# Patient Record
Sex: Female | Born: 1935
Health system: Southern US, Community
[De-identification: ages and names within clinical notes are randomized; demographics above are authoritative.]

## PROBLEM LIST (undated history)

## (undated) DIAGNOSIS — E119 Type 2 diabetes mellitus without complications: Secondary | ICD-10-CM

## (undated) HISTORY — PX: ANKLE SURGERY: SHX546

---

## 2001-10-25 ENCOUNTER — Encounter: Payer: Self-pay | Admitting: Family Medicine

## 2001-10-25 ENCOUNTER — Ambulatory Visit (HOSPITAL_COMMUNITY): Admission: RE | Admit: 2001-10-25 | Discharge: 2001-10-25 | Payer: Self-pay | Admitting: Family Medicine

## 2002-07-31 ENCOUNTER — Ambulatory Visit (HOSPITAL_COMMUNITY): Admission: RE | Admit: 2002-07-31 | Discharge: 2002-07-31 | Payer: Self-pay | Admitting: Family Medicine

## 2002-07-31 ENCOUNTER — Encounter: Payer: Self-pay | Admitting: Family Medicine

## 2002-11-22 ENCOUNTER — Encounter: Payer: Self-pay | Admitting: Family Medicine

## 2002-11-22 ENCOUNTER — Ambulatory Visit (HOSPITAL_COMMUNITY): Admission: RE | Admit: 2002-11-22 | Discharge: 2002-11-22 | Payer: Self-pay | Admitting: Family Medicine

## 2003-02-20 ENCOUNTER — Ambulatory Visit (HOSPITAL_COMMUNITY): Admission: RE | Admit: 2003-02-20 | Discharge: 2003-02-20 | Payer: Self-pay | Admitting: Family Medicine

## 2003-02-20 ENCOUNTER — Encounter: Payer: Self-pay | Admitting: Family Medicine

## 2005-05-17 ENCOUNTER — Ambulatory Visit (HOSPITAL_COMMUNITY): Admission: RE | Admit: 2005-05-17 | Discharge: 2005-05-17 | Payer: Self-pay | Admitting: Family Medicine

## 2005-09-06 ENCOUNTER — Ambulatory Visit (HOSPITAL_COMMUNITY): Admission: RE | Admit: 2005-09-06 | Discharge: 2005-09-06 | Payer: Self-pay | Admitting: Family Medicine

## 2005-11-21 ENCOUNTER — Encounter (HOSPITAL_BASED_OUTPATIENT_CLINIC_OR_DEPARTMENT_OTHER): Admission: RE | Admit: 2005-11-21 | Discharge: 2006-01-02 | Payer: Self-pay | Admitting: Surgery

## 2006-01-03 ENCOUNTER — Ambulatory Visit (HOSPITAL_COMMUNITY): Admission: RE | Admit: 2006-01-03 | Discharge: 2006-01-03 | Payer: Self-pay | Admitting: Family Medicine

## 2007-05-07 ENCOUNTER — Encounter (HOSPITAL_BASED_OUTPATIENT_CLINIC_OR_DEPARTMENT_OTHER): Admission: RE | Admit: 2007-05-07 | Discharge: 2007-06-19 | Payer: Self-pay | Admitting: Surgery

## 2007-06-18 ENCOUNTER — Encounter (HOSPITAL_BASED_OUTPATIENT_CLINIC_OR_DEPARTMENT_OTHER): Admission: RE | Admit: 2007-06-18 | Discharge: 2007-09-16 | Payer: Self-pay | Admitting: Surgery

## 2010-04-05 ENCOUNTER — Ambulatory Visit (HOSPITAL_COMMUNITY): Admission: RE | Admit: 2010-04-05 | Discharge: 2010-04-05 | Payer: Self-pay | Admitting: Family Medicine

## 2010-06-08 ENCOUNTER — Ambulatory Visit (HOSPITAL_COMMUNITY): Admission: RE | Admit: 2010-06-08 | Discharge: 2010-06-08 | Payer: Self-pay | Admitting: Family Medicine

## 2010-06-18 ENCOUNTER — Ambulatory Visit (HOSPITAL_COMMUNITY): Admission: RE | Admit: 2010-06-18 | Discharge: 2010-06-18 | Payer: Self-pay | Admitting: Orthopedic Surgery

## 2010-12-02 LAB — CBC
Hemoglobin: 13.2 g/dL (ref 12.0–15.0)
MCH: 29.1 pg (ref 26.0–34.0)
MCHC: 33.2 g/dL (ref 30.0–36.0)
RBC: 4.54 MIL/uL (ref 3.87–5.11)
WBC: 6 10*3/uL (ref 4.0–10.5)

## 2010-12-02 LAB — ANAEROBIC CULTURE

## 2010-12-02 LAB — BASIC METABOLIC PANEL
BUN: 10 mg/dL (ref 6–23)
CO2: 27 mEq/L (ref 19–32)
Calcium: 9.2 mg/dL (ref 8.4–10.5)
Chloride: 105 mEq/L (ref 96–112)
GFR calc non Af Amer: >60 mL/min (ref >60)
Sodium: 140 mEq/L (ref 135–145)

## 2010-12-02 LAB — AFB CULTURE WITH SMEAR (NOT AT ARMC)

## 2010-12-02 LAB — TISSUE CULTURE: Culture: NO GROWTH

## 2011-02-01 NOTE — Assessment & Plan Note (Signed)
[facility]   NAMEWANITA, Tamara Contreras                ACCOUNT NO.:  [id number]   MEDICAL RECORD NO.:  [id number]      DATE OF BIRTH:  1929-08-29   PHYSICIAN:  Theresia Majors. Tanda Rockers, M.D. VISIT DATE:  06/21/2007                                   OFFICE VISIT   SUBJECTIVE:  Tamara Contreras is a 77 year old lady who we have followed for  stasis ulcer involving her right lower extremity.  In the interim we  have treated her with a Unna wrap.  She returns for follow-up.  There  has been no interim excessive drainage, malodor, pain or fever.  She has  complained of some tightness of the wrap.   OBJECTIVE:  Blood pressure is 181/66, respirations 18, pulse rate 71.  She is accompanied by her daughter.  Inspection of the right lower  extremity shows that the edema has been well controlled with the Unna  wrap.  There is no evidence of wrap injury. The wound is 99% re-  epithelialization. Prominent chronic venous stasis changes are  persistent. The dorsalis pedis pulses 3+.   ASSESSMENT:  Clinical improvement of the stasis ulcer.   PLAN:  We have given the patient prescription for 30-40 mm below-the-  knee compression hose.  We have encouraged her to procure this garment  and to bring it with her during her next visit we anticipate that the  wound should be completely resolved and we will make a transition to the  support hose.  We have given the daughter and the patient opportunity to  ask questions.  They seem to understand and indicate that they will be  compliant.      Harold A. Tanda Rockers, M.D.  Electronically Signed     HAN/MEDQ  D:  06/21/2007  T:  06/21/2007  Job:  [id number]

## 2011-02-01 NOTE — Consult Note (Signed)
Tamara Contreras, Tamara Contreras                ACCOUNT NO.:  [id number]   MEDICAL RECORD NO.:  [id number]          PATIENT TYPE:  REC   LOCATION:  FOOT                         FACILITY:  [facility]   PHYSICIAN:  Jonelle Sports. Sevier, M.D. DATE OF BIRTH:  Oct 08, 1928   DATE OF CONSULTATION:  06/06/2007  DATE OF DISCHARGE:                                 CONSULTATION   HISTORY:  This 77 year old white female is followed for a recurrent and  somewhat refractory venous stasis ulceration on the medial aspect of the  right lower extremity.   She has been under treatment here again since August 2008, and has shown  some gradual improvement but is not yet healed.  She has been having  Unna wraps with the weekly changes.   She reports no problems since her previous visit except that she feels  the wrap is a little tight at its proximal margin.  The skin does show  some  imprintation, but there is no evidence of any injury from the wrap  itself.   She has noted no increase in pain.  No odor, no fever or systemic  symptoms.   PHYSICAL EXAMINATION:  VITAL SIGNS:  Blood pressure 159/84, pulse 85,  respirations 16, temperature 98.5.  The right lower extremity is free of  edema from the knee down where it has been involved in the wrap.  There  is evidence of chronic venous insufficiency and some stasis skin  changes.  The ulcer itself now measures 0.9 x 0.9 x 0.05 cm, has a clean  base, some loose epidermis around the margins.  There is no odor and no  significant drainage.   IMPRESSION:  Satisfactory progress chronic venous ulcer.   DISPOSITION:  1. The loose skin at the wound margins is selectively debrided.  The      wound is then treated with an application of Promogran, and that      extremity placed again in an Unna wrap.  2. Follow-up visit will be here in one week.           ______________________________  Jonelle Sports. Cheryll Cockayne, M.D.     RES/MEDQ  D:  06/06/2007  T:  06/06/2007  Job:  [phone]

## 2011-02-01 NOTE — Assessment & Plan Note (Signed)
[facility]   NAMEBLAKLEE, SHORES                ACCOUNT NO.:  [id number]   MEDICAL RECORD NO.:  [id number]      DATE OF BIRTH:  June 24, 1929   PHYSICIAN:  Theresia Majors. Tanda Rockers, M.D. VISIT DATE:  05/24/2007                                   OFFICE VISIT   SUBJECTIVE:  Ms. Achenbach is a 77 year old lady who we are following for a  stasis ulcer involving the medial right lower extremity.  In the  interim, we have treated her with Promogran and an Unna wrap.  She  returns for followup.  There has been mild drainage, but no malodor, no  pain and no fever.  She is accompanied by her daughter.   OBJECTIVE:  Blood pressure is 169/81, respirations 16, pulse rate 85,  temperature 98.1.  Inspection of the wound shows that there is 100%  granulating base.  The wound is rather deep.  No debridement is needed.  The wound was measured, photographed and catalogued.  Please refer to  the data entries.  The pedal pulse is 3+ and bounding.   ASSESSMENT:  Clinical response to compression.   PLAN:  Continue the Unna wrap q.week per the wound nurse visits only,  with the M.D. to follow up on a monthly basis.  The patient will also  proceed with fitting for below-the-knee 30-40-mm compression hose.      Harold A. Tanda Rockers, M.D.  Electronically Signed     HAN/MEDQ  D:  05/24/2007  T:  05/24/2007  Job:  [id number]

## 2011-02-01 NOTE — Assessment & Plan Note (Signed)
[facility]   NAMECAPRISHA, Tamara                ACCOUNT NO.:  [id number]   MEDICAL RECORD NO.:  [id number]      DATE OF BIRTH:  09/06/1929   PHYSICIAN:  Theresia Majors. Tanda Rockers, M.D. VISIT DATE:  05/09/2007                                   OFFICE VISIT   SUBJECTIVE:  Tamara Contreras is a 77 year old lady who was last seen in the  wound center in March 2007, at time she was successfully treated for a  stasis ulcer involving the left lower extremity.  She was discharged  with compression hose and has done well until recently.  She developed a  blister approximately 4 weeks ago, which ruptured.  The patient treated  herself with topical neosporin, but in spite of this, the wound has  become larger.  She was seen by her primary care physician, Dr. Butch Penny and was referred to the wound center.  She has had no major  changes in her medication list.  In the interim she has been on  Levaquin.   OBJECTIVE:  Blood pressure is 170/86, respirations 18, pulse rate 95,  temperature 97.1.  She is accompanied by her daughter.  Inspection of  the lower extremity shows that there is bilateral 2+ edema with chronic  changes of stasis including several prominent varicosities.  On the left  medial ankle there is desquamation with extremely thin skin and  impending ulceration.  There is 3+ dorsalis pedis pulse on the right  lower extremity.  There is frank punched-out ulceration over the  superior to the medial malleolus with discoloration.  There is no  evidence of abscess formation, lymphangitis, or ascending cellulitis.   ASSESSMENT:  Bilateral stasis with ulcerations.   PLAN:  We have placed the patient in bilateral compression wraps with  triamcinolone ointment and  Silverlon swathes.  We will reevaluate her  in one week in the nurses only clinic.  She will be evaluated by a  physician in one month p.r.n.      Harold A. Tanda Rockers, M.D.  Electronically Signed     HAN/MEDQ   D:  05/09/2007  T:  05/10/2007  Job:  [id number]

## 2011-02-01 NOTE — Assessment & Plan Note (Signed)
[facility]   Tamara Contreras, Tamara Contreras                ACCOUNT NO.:  [id number]   MEDICAL RECORD NO.:  [id number]      DATE OF BIRTH:  [birth year]   PHYSICIAN:  Theresia Majors. Tanda Rockers, M.D. VISIT DATE:  05/17/2007                                   OFFICE VISIT   SUBJECTIVE:  Tamara Contreras returns for followup of bilateral stasis with  ulceration involving the medial aspect of the right lower extremity.  In  the interim, she denies excessive drainage, malodor, pain, or fever.  She is accompanied by her daughter.   OBJECTIVE:  VITAL SIGNS:  Blood pressure is 172/79, respirations 16,  pulse rate 88, temperature 97.1.  EXTREMITIES:  Inspection of the right medial leg shows that the stasis  ulcer has contracted slightly.  There is undermining with shaggy  necrotic nonviable tissue at the circumference.  An EMLA cream was  applied topically.  Thereafter, an excisional debridement was performed  without difficulty.  The skin, subcutaneous tissue, and chronic  inflammatory reactive tissue was removed.  Hemorrhage was controlled  with direct pressure.  The pedal pulses remained +3 bilaterally.  There  is associated bilateral 2+ edema with chronic changes of stasis.  There  is no evidence of ascending infection or abscess.   ASSESSMENT:  Improved stasis, left lower extremity; adequately debrided  stasis ulcer on the right lower extremity.   PLAN:  We have placed the patient in a matrix dressing with an Unna  wrap.  We will reevaluate her in 1 week.  We have also given her a  prescription for bilateral below-the-knee compression hose.  She will  procure the hose and begin wearing them on the left leg and wear one  stocking and bring the additional stocking on her next visit.      Harold A. Tanda Rockers, M.D.  Electronically Signed     HAN/MEDQ  D:  05/17/2007  T:  05/18/2007  Job:  [id number]

## 2011-02-01 NOTE — Consult Note (Signed)
Tamara Contreras, ROADCAP                ACCOUNT NO.:  [id number]   MEDICAL RECORD NO.:  [id number]          PATIENT TYPE:  REC   LOCATION:  FOOT                         FACILITY:  [facility]   PHYSICIAN:  Jonelle Sports. Sevier, M.D. DATE OF BIRTH:  1928-11-02   DATE OF CONSULTATION:  06/13/2007  DATE OF DISCHARGE:                                 CONSULTATION   HISTORY:  This 77 year old white female has been followed for a stasis  ulceration of the right lower extremity in the medial supramalleolar  (gaiter) area.  She has made progressive improvement in Unna wraps,  and last week, Promogram dressing was added directly to the wound  underneath the wrap and seems to have brought about dramatic change over  the past week.   The patient reports no new symptoms.  No intercurrent illness.  No  change in medications.  Incidentally, the matter of compression  stockings is broached with the patient, and she has an old pair which  are totally worn out and will need to be fitted for new ones once we can  take her out of the wrap which hopefully will be one week hence.   EXAMINATION:  Blood pressure 140/78, pulse 98 regular, respirations 18,  temperature 98.4.   The wound on the right lower extremity is very nearly healed with nice  granular base and only two pin holes totaling 0.2 x 0.5 x 0.05 in  dimension open areas remaining.  There is no significant edema.  No  significant drainage, no slough, no need for debridement.   IMPRESSION:  Venous stasis ulcer to the right lower extremity near  resolution.   DISPOSITION:  The wound is again dressed with a small application of  Promogram and then the extremity placed in an Unna wrap.   Follow-up visit will be here in 1 week.  It will be my intention to send  her at that time directly to the supplier for her to be fitted and  obtain stockings at that time.           ______________________________  Jonelle Sports Cheryll Cockayne, M.D.     RES/MEDQ  D:  06/13/2007   T:  06/14/2007  Job:  [id number]

## 2011-02-01 NOTE — Assessment & Plan Note (Signed)
[facility]   NAMEMEKAYLAH, Tamara Contreras                ACCOUNT NO.:  [id number]   MEDICAL RECORD NO.:  [id number]      DATE OF BIRTH:  04-Mar-1929   PHYSICIAN:  Theresia Majors. Tanda Rockers, M.D. VISIT DATE:  06/28/2007                                   OFFICE VISIT   SUBJECTIVE:  Tamara Contreras is a 77 year old lady who we have followed for  stasis ulceration involving her left lower extremity.  In the interim,  she has procured below-the-knee compression hose.  There has been no  excessive pain, malodor or weeping.  She is accompanied by her daughter.   Blood pressure is 147/64, pulse rates 81, respirations are 14,  temperature is 98.7.  Inspection of the right lower extremity shows that the previous stasis  ulcer is completely resolved.  There has been adequate control of edema  as indicated by the lineal wrinkles in the skin; capillary refill is  normal.  There is no evidence of an ascending infection.   ASSESSMENT:  Resolved stasis ulcerations.   PLAN:  We are today making a transition from the Unna compression wraps  to the bilateral below-the-knee 20-30 millimeter compression hose.  The  patient has been counseled regarding applications of these stockings.  We have given the daughter and the patient an opportunity to ask  questions.  They seem to be comfortable with the instructions and  indicate that they will be compliant.  We are discharging the patient  with instructions to keep all of her routine appointments with Dr.  Renard Matter.  Her stockings will have to be replaced between 3 and 4 months.  Whenever the stock is becoming easy to put on and off that will be an  indication that they have lost their elasticity and therefore their  effectiveness.  Tamara Contreras is discharged.      Harold A. Tanda Rockers, M.D.  Electronically Signed     HAN/MEDQ  D:  06/28/2007  T:  06/28/2007  Job:  [id number]   cc:   Angus G. Renard Matter, MD

## 2011-02-01 NOTE — Consult Note (Signed)
[name], Tamara Contreras                ACCOUNT NO.:  [id number]   MEDICAL RECORD NO.:  [id number]           PATIENT TYPE:   LOCATION:                                 FACILITY:   PHYSICIAN:  Tamara Sports. Contreras, M.D. DATE OF BIRTH:  11-27-1928   DATE OF CONSULTATION:  07/10/2007  DATE OF DISCHARGE:                                 CONSULTATION   HISTORY:  This 77 year old white female is followed for varicose veins  with venous retention and a venous stasis ulcer in the gaiter area of  the right lower extremity.   When she was seen a week ago, this lesion was felt to have been  completely healed and she was placed in 2030 compression hose.  She is  seen again today for follow-up to see how that has been tolerated.   The patient and her daughter report no problems in the interim week but  they do say that getting the stockings on is somewhat difficult and they  fear reinjury to the area.   Examination today blood pressure 171/79, pulse 80 and  regular,  respirations 16, temperature 98.2.   The wound on the gaiter area of the right lower extremity is well-  healed.  There is some minimal flaking of the skin in that area which  seems to alarm the patient and so that was very cautiously removed with  the edge of a paper ruler to create a very smooth healed wound surface.   IMPRESSION:  Stasis ulceration right lower extremity healed.   DISPOSITION:  The wound area is covered with a small self-adherent leave-  in pad just for her protection and the patient and her daughter are  assisted here to return her to the 2030 compression stocking.   They are instructed to continue this stocking on a daily basis at home  and seem to understand those instructions.   Follow-up visit to this facility will be on a p.r.n. basis.           ______________________________  Tamara Sports Cheryll Cockayne, M.D.     RES/MEDQ  D:  07/10/2007  T:  07/10/2007  Job:  [id number]

## 2011-02-04 NOTE — Consult Note (Signed)
NAMESHEVETTE, BESS                ACCOUNT NO.:  [id number]   MEDICAL RECORD NO.:  [id number]          PATIENT TYPE:  OUT   LOCATION:  RAD                           FACILITY:  [facility]   PHYSICIAN:  Harold A. Tanda Rockers, M.D.DATE OF BIRTH:  1929/08/24   DATE OF CONSULTATION:  11/25/2005  DATE OF DISCHARGE:  09/06/2005                                   CONSULTATION   REASON FOR CONSULTATION:  Miss Netherland is a 77 year old female referred by  Dr. Renard Matter from [place] for the evaluation of a left lower extremity  ulceration.   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   RECOMMENDATIONS:  Proceed with Unna boot protocol.   SUBJECTIVE:  Miss Dubas is a 77 year old lady who has been in usual with  good health but has noticed progressive swelling and discoloration in both  of her lower extremities over the past years. Four months ago she noted an  ulceration on the medial aspect of the left lower extremity. This has become  progressively worse and has been refractory to local cleansing and salves.  She continues to complain of drainage as well as itching and pain. She was  seen by Dr. Renard Matter and a referral was made to [facility].   PAST MEDICAL HISTORY:  Remarkable for a PENICILLIN allergy.   CURRENT MEDICATIONS:  1.  Lasix 20 mg daily.  2.  Cipro 500 mg b.i.d.  3.  She has been advised to wear support hose but does not wear them.   PAST SURGICAL HISTORY:  Her previous surgery has included an open reduction  and internal fixation of the left ankle approximately 4 years ago.   FAMILY HISTORY:  Positive for hypertension, negative for diabetes, positive  for cancer and stroke.   SOCIAL HISTORY:  She lives in [place]. She attends to a dependent  sibling.   REVIEW OF SYSTEMS:  Specifically negative for angina pectoris, visual  changes or transient paralysis which would be consistent with TIAs. Her  weight  has been stable. She denies polyuria, polydipsia, has no history of  diabetes. She denies bowel or bladder dysfunction. The remainder of the  review of systems is negative.   PHYSICAL EXAM:  GENERAL:  She is an alert, oriented female. She is  accompanied by her daughter. She is in good contact with reality.  HEENT:  The HEENT exam is clear.  NECK:  Supple.  LUNGS:  Clear.  ABDOMEN:  The abdomen is soft.  EXTREMITIES:  Femoral pulses 3+, dorsalis pedis pulses are 3+ bilaterally.  There are chronic changes in both lower extremities consistent with stasis.  There is a well-formed ulceration in the medial aspect of the left lower  extremity which has been photographed and entered into the wound expert. The  right lower extremity exam is remarkable for 2+ nontender brawny edema.  There is moderate tenderness on the left lower extremity. There is no  loculation or abscess.  NEUROLOGIC:  Neurologically the patient retains protective sensation.   DISCUSSION:  We have explained the treatment protocol utilizing external  compression in terms that Ms. Jimmye Norman and her daughter seem to understand. We  proceed with applying an Radio broadcast assistant dressing. We will see her in 1 week. The  patient and her daughter express gratitude at having been seen in the clinic  and indicate that they will comply with the recommendations.           ______________________________  Theresia Majors Tanda Rockers, M.D.     Cephus Slater  D:  11/25/2005  T:  11/26/2005  Job:  [id number]   cc:   Angus G. Renard Matter, MD  Fax: [phone]

## 2011-02-17 ENCOUNTER — Emergency Department (HOSPITAL_COMMUNITY): Payer: Medicare Other

## 2011-02-17 ENCOUNTER — Emergency Department (HOSPITAL_COMMUNITY)
Admission: EM | Admit: 2011-02-17 | Discharge: 2011-02-17 | Disposition: A | Discharge status: Home / Self Care | Payer: Medicare Other | Attending: Emergency Medicine | Admitting: Emergency Medicine

## 2011-02-17 DIAGNOSIS — Y92009 Unspecified place in unspecified non-institutional (private) residence as the place of occurrence of the external cause: Secondary | ICD-10-CM | POA: Insufficient documentation

## 2011-02-17 DIAGNOSIS — X58XXXA Exposure to other specified factors, initial encounter: Secondary | ICD-10-CM | POA: Insufficient documentation

## 2011-02-17 DIAGNOSIS — M79609 Pain in unspecified limb: Secondary | ICD-10-CM | POA: Insufficient documentation

## 2011-02-17 DIAGNOSIS — IMO0002 Reserved for concepts with insufficient information to code with codable children: Secondary | ICD-10-CM | POA: Insufficient documentation

## 2011-02-17 DIAGNOSIS — Z86718 Personal history of other venous thrombosis and embolism: Secondary | ICD-10-CM | POA: Insufficient documentation

## 2013-01-10 ENCOUNTER — Other Ambulatory Visit (HOSPITAL_COMMUNITY): Payer: Self-pay | Admitting: Family Medicine

## 2013-01-10 ENCOUNTER — Ambulatory Visit (HOSPITAL_COMMUNITY)
Admission: RE | Admit: 2013-01-10 | Discharge: 2013-01-10 | Disposition: A | Discharge status: Home / Self Care | Payer: Medicare Other | Source: Ambulatory Visit | Attending: Family Medicine | Admitting: Family Medicine

## 2013-01-10 DIAGNOSIS — L0390 Cellulitis, unspecified: Secondary | ICD-10-CM

## 2013-01-10 DIAGNOSIS — R609 Edema, unspecified: Secondary | ICD-10-CM

## 2013-01-10 DIAGNOSIS — L0291 Cutaneous abscess, unspecified: Secondary | ICD-10-CM | POA: Insufficient documentation

## 2013-01-10 DIAGNOSIS — M7989 Other specified soft tissue disorders: Secondary | ICD-10-CM | POA: Insufficient documentation

## 2013-02-12 ENCOUNTER — Ambulatory Visit (HOSPITAL_COMMUNITY)
Admission: RE | Admit: 2013-02-12 | Discharge: 2013-02-12 | Disposition: A | Discharge status: Home / Self Care | Payer: Medicare Other | Source: Ambulatory Visit | Attending: Family Medicine | Admitting: Family Medicine

## 2013-02-12 ENCOUNTER — Other Ambulatory Visit (HOSPITAL_COMMUNITY): Payer: Self-pay | Admitting: Family Medicine

## 2013-02-12 DIAGNOSIS — R609 Edema, unspecified: Secondary | ICD-10-CM

## 2013-02-12 DIAGNOSIS — M7989 Other specified soft tissue disorders: Secondary | ICD-10-CM | POA: Insufficient documentation

## 2014-06-12 ENCOUNTER — Encounter (HOSPITAL_COMMUNITY): Payer: Self-pay | Admitting: Emergency Medicine

## 2014-06-12 ENCOUNTER — Emergency Department (HOSPITAL_COMMUNITY)
Admission: EM | Admit: 2014-06-12 | Discharge: 2014-06-12 | Disposition: A | Discharge status: Home / Self Care | Payer: Medicare Other | Attending: Emergency Medicine | Admitting: Emergency Medicine

## 2014-06-12 ENCOUNTER — Emergency Department (HOSPITAL_COMMUNITY): Payer: Medicare Other

## 2014-06-12 DIAGNOSIS — K828 Other specified diseases of gallbladder: Secondary | ICD-10-CM | POA: Diagnosis not present

## 2014-06-12 DIAGNOSIS — N200 Calculus of kidney: Secondary | ICD-10-CM | POA: Diagnosis not present

## 2014-06-12 DIAGNOSIS — R109 Unspecified abdominal pain: Secondary | ICD-10-CM | POA: Insufficient documentation

## 2014-06-12 DIAGNOSIS — E119 Type 2 diabetes mellitus without complications: Secondary | ICD-10-CM | POA: Diagnosis not present

## 2014-06-12 HISTORY — DX: Type 2 diabetes mellitus without complications: E11.9

## 2014-06-12 LAB — LIPASE, BLOOD: Lipase: 47 U/L (ref 11–59)

## 2014-06-12 LAB — URINE MICROSCOPIC-ADD ON

## 2014-06-12 LAB — COMPREHENSIVE METABOLIC PANEL
ALBUMIN: 4.3 g/dL (ref 3.5–5.2)
ALK PHOS: 82 U/L (ref 39–117)
ALT: 11 U/L (ref 0–35)
AST: 24 U/L (ref 0–37)
Anion gap: 14 (ref 5–15)
BUN: 15 mg/dL (ref 6–23)
CHLORIDE: 101 meq/L (ref 96–112)
CO2: 26 mEq/L (ref 19–32)
Calcium: 9.3 mg/dL (ref 8.4–10.5)
Creatinine, Ser: 0.83 mg/dL (ref 0.50–1.10)
GFR calc Af Amer: 74 mL/min — ABNORMAL LOW (ref >90)
GFR, EST NON AFRICAN AMERICAN: 63 mL/min — AB (ref >90)
Glucose, Bld: 114 mg/dL — ABNORMAL HIGH (ref 70–99)
Potassium: 4.1 mEq/L (ref 3.7–5.3)
Sodium: 141 mEq/L (ref 137–147)
TOTAL PROTEIN: 8.8 g/dL — AB (ref 6.0–8.3)
Total Bilirubin: 0.5 mg/dL (ref 0.3–1.2)

## 2014-06-12 LAB — CBC WITH DIFFERENTIAL/PLATELET
Basophils Absolute: 0 10*3/uL (ref 0.0–0.1)
Basophils Relative: 0 % (ref 0–1)
EOS ABS: 0 10*3/uL (ref 0.0–0.7)
Eosinophils Relative: 0 % (ref 0–5)
HCT: 39.4 % (ref 36.0–46.0)
HEMOGLOBIN: 12.9 g/dL (ref 12.0–15.0)
LYMPHS PCT: 7 % — AB (ref 12–46)
Lymphs Abs: 0.6 10*3/uL — ABNORMAL LOW (ref 0.7–4.0)
MCH: 29.3 pg (ref 26.0–34.0)
MCHC: 32.7 g/dL (ref 30.0–36.0)
MCV: 89.3 fL (ref 78.0–100.0)
MONO ABS: 0.3 10*3/uL (ref 0.1–1.0)
Monocytes Relative: 3 % (ref 3–12)
Neutro Abs: 7.2 10*3/uL (ref 1.7–7.7)
Neutrophils Relative %: 90 % — ABNORMAL HIGH (ref 43–77)
PLATELETS: 162 10*3/uL (ref 150–400)
RBC: 4.41 MIL/uL (ref 3.87–5.11)
RDW: 15.1 % (ref 11.5–15.5)
WBC: 8.1 10*3/uL (ref 4.0–10.5)

## 2014-06-12 LAB — URINALYSIS, ROUTINE W REFLEX MICROSCOPIC
BILIRUBIN URINE: NEGATIVE
GLUCOSE, UA: NEGATIVE mg/dL
KETONES UR: NEGATIVE mg/dL
LEUKOCYTES UA: NEGATIVE
NITRITE: NEGATIVE
PROTEIN: NEGATIVE mg/dL
SPECIFIC GRAVITY, URINE: 1.01 (ref 1.005–1.030)
UROBILINOGEN UA: 0.2 mg/dL (ref 0.0–1.0)
pH: 7.5 (ref 5.0–8.0)

## 2014-06-12 LAB — TROPONIN I: Troponin I: <0.3 ng/mL (ref <0.30)

## 2014-06-12 MED ORDER — ONDANSETRON HCL 4 MG/2ML IJ SOLN
4.0000 mg | Freq: Once | INTRAMUSCULAR | Status: AC
  Administered 2014-06-12: 4 mg via INTRAVENOUS
  Filled 2014-06-12: qty 2

## 2014-06-12 MED ORDER — KETOROLAC TROMETHAMINE 30 MG/ML IJ SOLN
15.0000 mg | Freq: Once | INTRAMUSCULAR | Status: AC
  Administered 2014-06-12: 15 mg via INTRAVENOUS
  Filled 2014-06-12: qty 1

## 2014-06-12 MED ORDER — TAMSULOSIN HCL 0.4 MG PO CAPS: 0.4000 mg | ORAL_CAPSULE | Freq: Every day | ORAL | Status: DC

## 2014-06-12 MED ORDER — SODIUM CHLORIDE 0.9 % IV BOLUS (SEPSIS)
500.0000 mL | Freq: Once | INTRAVENOUS | Status: AC
  Administered 2014-06-12: 500 mL via INTRAVENOUS

## 2014-06-12 MED ORDER — FENTANYL CITRATE 0.05 MG/ML IJ SOLN
50.0000 ug | Freq: Once | INTRAMUSCULAR | Status: AC
  Administered 2014-06-12: 50 ug via INTRAVENOUS
  Filled 2014-06-12: qty 2

## 2014-06-12 MED ORDER — ONDANSETRON 4 MG PO TBDP: 4.0000 mg | ORAL_TABLET | Freq: Three times a day (TID) | ORAL | Status: DC | PRN

## 2014-06-12 MED ORDER — HYDROCODONE-ACETAMINOPHEN 5-325 MG PO TABS: 0.5000 | ORAL_TABLET | Freq: Four times a day (QID) | ORAL | Status: DC | PRN

## 2014-06-12 NOTE — ED Notes (Signed)
Urine strainers given x 2 and nun's cap.

## 2014-06-12 NOTE — Discharge Instructions (Signed)
You were found to have a left-sided 3 mm kidney stone which is likely the cause of your left back pain. This will likely pass on its own but if you continue to have symptoms without improvement, please followup with your primary care physician or urologist. Dineen Kid also found to have a calcified gallbladder called a porcelain gallbladder. Your liver tests and pancreas tests today were normal and you are not having tenderness in his right upper part of your abdomen. In some people, having a calcified gallbladder may indicate that they have biliary cancer. You may followup with Dr. Christella Hartigan with surgery for further evaluation.   Dietary Guidelines to Help Prevent Kidney Stones Your risk of kidney stones can be decreased by adjusting the foods you eat. The most important thing you can do is drink enough fluid. You should drink enough fluid to keep your urine clear or pale yellow. The following guidelines provide specific information for the type of kidney stone you have had. GUIDELINES ACCORDING TO TYPE OF KIDNEY STONE Calcium Oxalate Kidney Stones  Reduce the amount of salt you eat. Foods that have a lot of salt cause your body to release excess calcium into your urine. The excess calcium can combine with a substance called oxalate to form kidney stones.  Reduce the amount of animal protein you eat if the amount you eat is excessive. Animal protein causes your body to release excess calcium into your urine. Ask your dietitian how much protein from animal sources you should be eating.  Avoid foods that are high in oxalates. If you take vitamins, they should have less than 500 mg of vitamin C. Your body turns vitamin C into oxalates. You do not need to avoid fruits and vegetables high in vitamin C. Calcium Phosphate Kidney Stones  Reduce the amount of salt you eat to help prevent the release of excess calcium into your urine.  Reduce the amount of animal protein you eat if the amount you eat is excessive.  Animal protein causes your body to release excess calcium into your urine. Ask your dietitian how much protein from animal sources you should be eating.  Get enough calcium from food or take a calcium supplement (ask your dietitian for recommendations). Food sources of calcium that do not increase your risk of kidney stones include:  Broccoli.  Dairy products, such as cheese and yogurt.  Pudding. Uric Acid Kidney Stones  Do not have more than 6 oz of animal protein per day. FOOD SOURCES Animal Protein Sources  Meat (all types).  Poultry.  Eggs.  Fish, seafood. Foods High in [employer] seasonings.  Soy sauce.  Teriyaki sauce.  Cured and processed meats.  Salted crackers and snack foods.  Fast food.  Canned soups and most canned foods. Foods High in Oxalates  Grains:  Amaranth.  Barley.  Grits.  Wheat germ.  Bran.  Buckwheat flour.  All bran cereals.  Pretzels.  Whole wheat bread.  Vegetables:  Beans (wax).  Beets and beet greens.  Collard greens.  Eggplant.  Escarole.  Leeks.  Okra.  Parsley.  Rutabagas.  Spinach.  Swiss chard.  Tomato paste.  Fried potatoes.  Sweet potatoes.  Fruits:  Red currants.  Figs.  Kiwi.  Rhubarb.  Meat and Other Protein Sources:  Beans (dried).  Soy burgers and other soybean products.  Miso.  Nuts (peanuts, almonds, pecans, cashews, hazelnuts).  Nut butters.  Sesame seeds and tahini (paste made of sesame seeds).  Poppy seeds.  Beverages:  Chocolate  drink mixes.  Soy milk.  Instant iced tea.  Juices made from high-oxalate fruits or vegetables.  Other:  Carob.  Chocolate.  Fruitcake.  Marmalades. Document Released: 12/31/2010 Document Revised: 09/10/2013 Document Reviewed: 08/02/2013 [facility] Patient Information 2015 [employer], [place]. This information is not intended to replace advice given to you by your health care provider. Make sure you discuss any  questions you have with your health care provider.  Kidney Stones Kidney stones (urolithiasis) are deposits that form inside your kidneys. The intense pain is caused by the stone moving through the urinary tract. When the stone moves, the ureter goes into spasm around the stone. The stone is usually passed in the urine.  CAUSES   A disorder that makes certain neck glands produce too much parathyroid hormone (primary hyperparathyroidism).  A buildup of uric acid crystals, similar to gout in your joints.  Narrowing (stricture) of the ureter.  A kidney obstruction present at birth (congenital obstruction).  Previous surgery on the kidney or ureters.  Numerous kidney infections. SYMPTOMS   Feeling sick to your stomach (nauseous).  Throwing up (vomiting).  Blood in the urine (hematuria).  Pain that usually spreads (radiates) to the groin.  Frequency or urgency of urination. DIAGNOSIS   Taking a history and physical exam.  Blood or urine tests.  CT scan.  Occasionally, an examination of the inside of the urinary bladder (cystoscopy) is performed. TREATMENT   Observation.  Increasing your fluid intake.  Extracorporeal shock wave lithotripsy--This is a noninvasive procedure that uses shock waves to break up kidney stones.  Surgery may be needed if you have severe pain or persistent obstruction. There are various surgical procedures. Most of the procedures are performed with the use of small instruments. Only small incisions are needed to accommodate these instruments, so recovery time is minimized. The size, location, and chemical composition are all important variables that will determine the proper choice of action for you. Talk to your health care provider to better understand your situation so that you will minimize the risk of injury to yourself and your kidney.  HOME CARE INSTRUCTIONS   Drink enough water and fluids to keep your urine clear or pale yellow. This will  help you to pass the stone or stone fragments.  Strain all urine through the provided strainer. Keep all particulate matter and stones for your health care provider to see. The stone causing the pain may be as small as a grain of salt. It is very important to use the strainer each and every time you pass your urine. The collection of your stone will allow your health care provider to analyze it and verify that a stone has actually passed. The stone analysis will often identify what you can do to reduce the incidence of recurrences.  Only take over-the-counter or prescription medicines for pain, discomfort, or fever as directed by your health care provider.  Make a follow-up appointment with your health care provider as directed.  Get follow-up X-rays if required. The absence of pain does not always mean that the stone has passed. It may have only stopped moving. If the urine remains completely obstructed, it can cause loss of kidney function or even complete destruction of the kidney. It is your responsibility to make sure X-rays and follow-ups are completed. Ultrasounds of the kidney can show blockages and the status of the kidney. Ultrasounds are not associated with any radiation and can be performed easily in a matter of minutes. SEEK MEDICAL CARE IF:  You experience pain that is progressive and unresponsive to any pain medicine you have been prescribed. SEEK IMMEDIATE MEDICAL CARE IF:   Pain cannot be controlled with the prescribed medicine.  You have a fever or shaking chills.  The severity or intensity of pain increases over 18 hours and is not relieved by pain medicine.  You develop a new onset of abdominal pain.  You feel faint or pass out.  You are unable to urinate. MAKE SURE YOU:   Understand these instructions.  Will watch your condition.  Will get help right away if you are not doing well or get worse. Document Released: 09/05/2005 Document Revised: 05/08/2013 Document  Reviewed: 02/06/2013 [facility] Patient Information 2015 [place], [place]. This information is not intended to replace advice given to you by your health care provider. Make sure you discuss any questions you have with your health care provider.

## 2014-06-12 NOTE — ED Notes (Signed)
Pt states that she woke up this morning and had lower left side pain. Pt complains of nausea and vomiting started this morning. No complaint of problems urinating.

## 2014-06-12 NOTE — ED Provider Notes (Addendum)
TIME SEEN: 1:35 PM  CHIEF COMPLAINT: Left flank pain, constipation  HPI: Patient is a 80 y.o. F with history of diabetes, venous stasis dermatitis and chronic bilateral lower extremity pain who presents the emergency department with left-sided flank pain that started earlier this morning. She reports she's had nausea and one episode of nonbloody, nonbilious vomiting. She states she feels constipated and that her abdomen is distended. She feels like she "needs to burp" but cannot. She denies any similar symptoms in the past. Denies a history of kidney stones. No prior history of abdominal surgery. Denies any sick contacts or recent travel. No fevers, chills, chest pain or shortness of breath, diarrhea, bloody stool or melena, dysuria or hematuria.   Patient is a very poor historian. She seems to be more focused on her chronic bilateral lower extremity pain. There is no increased swelling or lesions noted. Family reports that this is chronic condition for her she complains about this constantly. She has regular venous Dopplers twice a year that have always been negative. Denies chest pain or shortness of breath.  ROS: See HPI Constitutional: no fever  Eyes: no drainage  ENT: no runny nose   Cardiovascular:  no chest pain  Resp: no SOB  GI:  vomiting GU: no dysuria Integumentary: no rash  Allergy: no hives  Musculoskeletal: no leg swelling  Neurological: no slurred speech ROS otherwise negative  PAST MEDICAL HISTORY/PAST SURGICAL HISTORY:  Past Medical History  Diagnosis Date  . Diabetes mellitus without complication     MEDICATIONS:  Prior to Admission medications   Not on File    ALLERGIES:  Allergies  Allergen Reactions  . Penicillins Other (See Comments)    unknown    SOCIAL HISTORY:  History  Substance Use Topics  . Smoking status: Never Smoker   . Smokeless tobacco: Never Used  . Alcohol Use: No    FAMILY HISTORY: No family history on file.  EXAM: BP 147/82   Pulse 89  Temp(Src) 98.2 F (36.8 C) (Oral)  Resp 18  Ht  (1.626 m)  Wt 146 lb (66.225 kg)  BMI 25.05 kg/m2  SpO2 100% CONSTITUTIONAL: Alert and oriented and responds appropriately to questions. Well-appearing; well-nourished HEAD: Normocephalic EYES: Conjunctivae clear, PERRL ENT: normal nose; no rhinorrhea; moist mucous membranes; pharynx without lesions noted NECK: Supple, no meningismus, no LAD  CARD: RRR; S1 and S2 appreciated; no murmurs, no clicks, no rubs, no gallops RESP: Normal chest excursion without splinting or tachypnea; breath sounds clear and equal bilaterally; no wheezes, no rhonchi, no rales,  ABD/GI: Normal bowel sounds; mildly distended without tympany or fluid wave, nontender to palpation diffusely, no guarding or rebound, no peritoneal signs, no tenderness at McBurney's point, negative Murphy sign RECTAL:  Normal rectal tone, soft brown stool in the rectal vault, no gross blood or melena BACK:  The back appears normal and is non-tender to palpation, there is no CVA tenderness EXT: Normal ROM in all joints; non-tender to palpation; no edema; normal capillary refill; no cyanosis    SKIN: Normal color for age and race; warm, venous stasis dermatitis of her bilateral lower extremities without any open wounds NEURO: Moves all extremities equally PSYCH: The patient's mood and manner are appropriate. Grooming and personal hygiene are appropriate.  MEDICAL DECISION MAKING: Patient here with complaints of left flank pain. Differential diagnosis includes kidney stone, pyelonephritis, ACS, pneumonia, pancreatitis, small bowel traction, constipation. We'll obtain labs including troponin, urine, acute abdominal series. We'll give IV fluids and Zofran  and reassess.  ED PROGRESS: Labs are unremarkable but urine does show moderate hemoglobin without other sign of infection. Acute abdominal series shows fecal interior throughout the colon but no obstruction or free air. There is  bowel gas noted. Given left flank pain with moderate hemoglobin in her urine, will obtain a CT scan to evaluate for possible ureterolithiasis.   4:00 PM  Pt's CT scan shows left hydroureteronephrosis due to obstruction 3 mm stone at the left UVJ. There is also incidental finding of a porcelain gallbladder. She has no right upper quadrant tenderness on exam and her LFTs and lipase are normal. Suspect that she will be able to follow up with surgery as an outpatient but will discuss with Dr. Lovell Sheehan.  4:08 PM  D/w Dr. Lovell Sheehan who states there is nothing further that needs to be done regarding her porcelain gallbladder at this time but he is happy to see her in followup if the patient is interested as this may indicate that the patient has cancer. Discussed this with patient and family. We'll discharge patient home with Zofran, Vicodin, urology followup information, urine strainer.    Also discussed with family that I do not feel she needs emergent ultrasound of her legs today and she has never had a DVT and there is no change today. There is no unilateral swelling. We'll have her followup with her primary care physician for her chronic lower extremity pain.   EKG Interpretation  Date/Time:  Thursday June 12 2014 13:50:58 EDT Ventricular Rate:  88 PR Interval:  186 QRS Duration: 145 QT Interval:  431 QTC Calculation: 521 R Axis:   82 Text Interpretation:  Sinus rhythm Right bundle branch block No significant change since last tracing Confirmed by Samson Ralph,  DO, Jakyle Petrucelli [phone]) on 06/12/2014 2:08:43 PM        Layla Maw Kewan Mcnease, DO 06/12/14 1620  Layla Maw Christoph Copelan, DO 06/12/14 1633

## 2014-06-12 NOTE — ED Notes (Signed)
Patient with no complaints at this time. Respirations even and unlabored. Skin warm/dry. Discharge instructions reviewed with patient at this time. Patient given opportunity to voice concerns/ask questions. IV removed per policy and band-aid applied to site. Patient discharged at this time and left Emergency Department via wheelchair.  

## 2015-07-16 ENCOUNTER — Other Ambulatory Visit (HOSPITAL_COMMUNITY): Payer: Self-pay | Admitting: Family Medicine

## 2015-07-16 ENCOUNTER — Ambulatory Visit (HOSPITAL_COMMUNITY)
Admission: RE | Admit: 2015-07-16 | Discharge: 2015-07-16 | Disposition: A | Discharge status: Home / Self Care | Payer: Medicare Other | Source: Ambulatory Visit | Attending: Family Medicine | Admitting: Family Medicine

## 2015-07-16 DIAGNOSIS — M25461 Effusion, right knee: Secondary | ICD-10-CM | POA: Diagnosis not present

## 2015-07-16 DIAGNOSIS — L819 Disorder of pigmentation, unspecified: Secondary | ICD-10-CM

## 2015-07-16 DIAGNOSIS — M79661 Pain in right lower leg: Secondary | ICD-10-CM | POA: Diagnosis not present

## 2015-07-16 DIAGNOSIS — R52 Pain, unspecified: Secondary | ICD-10-CM

## 2015-07-16 DIAGNOSIS — M79662 Pain in left lower leg: Secondary | ICD-10-CM | POA: Insufficient documentation

## 2015-07-16 DIAGNOSIS — M25562 Pain in left knee: Secondary | ICD-10-CM | POA: Insufficient documentation

## 2015-07-16 DIAGNOSIS — M179 Osteoarthritis of knee, unspecified: Secondary | ICD-10-CM | POA: Diagnosis not present

## 2015-07-16 DIAGNOSIS — M7122 Synovial cyst of popliteal space [Baker], left knee: Secondary | ICD-10-CM | POA: Insufficient documentation

## 2015-07-16 DIAGNOSIS — M25561 Pain in right knee: Secondary | ICD-10-CM | POA: Insufficient documentation

## 2017-12-14 DIAGNOSIS — I8391 Asymptomatic varicose veins of right lower extremity: Secondary | ICD-10-CM | POA: Diagnosis not present

## 2017-12-14 DIAGNOSIS — K5900 Constipation, unspecified: Secondary | ICD-10-CM | POA: Diagnosis not present

## 2017-12-14 DIAGNOSIS — M6281 Muscle weakness (generalized): Secondary | ICD-10-CM | POA: Diagnosis not present

## 2018-03-02 DIAGNOSIS — S80812A Abrasion, left lower leg, initial encounter: Secondary | ICD-10-CM | POA: Diagnosis not present

## 2018-03-15 ENCOUNTER — Emergency Department (HOSPITAL_COMMUNITY)
Admission: EM | Admit: 2018-03-15 | Discharge: 2018-03-15 | Disposition: A | Discharge status: Home / Self Care | Payer: Medicare Other | Attending: Emergency Medicine | Admitting: Emergency Medicine

## 2018-03-15 ENCOUNTER — Emergency Department (HOSPITAL_COMMUNITY): Payer: Medicare Other

## 2018-03-15 ENCOUNTER — Other Ambulatory Visit: Payer: Self-pay

## 2018-03-15 ENCOUNTER — Encounter (HOSPITAL_COMMUNITY): Payer: Self-pay | Admitting: Emergency Medicine

## 2018-03-15 DIAGNOSIS — R296 Repeated falls: Secondary | ICD-10-CM | POA: Diagnosis not present

## 2018-03-15 DIAGNOSIS — S8012XA Contusion of left lower leg, initial encounter: Secondary | ICD-10-CM | POA: Diagnosis not present

## 2018-03-15 DIAGNOSIS — N3000 Acute cystitis without hematuria: Secondary | ICD-10-CM

## 2018-03-15 DIAGNOSIS — R4182 Altered mental status, unspecified: Secondary | ICD-10-CM | POA: Diagnosis not present

## 2018-03-15 DIAGNOSIS — R456 Violent behavior: Secondary | ICD-10-CM | POA: Diagnosis not present

## 2018-03-15 DIAGNOSIS — R457 State of emotional shock and stress, unspecified: Secondary | ICD-10-CM | POA: Diagnosis not present

## 2018-03-15 DIAGNOSIS — M79605 Pain in left leg: Secondary | ICD-10-CM | POA: Diagnosis not present

## 2018-03-15 DIAGNOSIS — R402441 Other coma, without documented Glasgow coma scale score, or with partial score reported, in the field [EMT or ambulance]: Secondary | ICD-10-CM | POA: Diagnosis not present

## 2018-03-15 LAB — URINALYSIS, ROUTINE W REFLEX MICROSCOPIC
Bilirubin Urine: NEGATIVE
Glucose, UA: NEGATIVE mg/dL
Ketones, ur: NEGATIVE mg/dL
Nitrite: POSITIVE — AB
Protein, ur: 30 mg/dL — AB
RBC / HPF: >50 RBC/hpf — ABNORMAL HIGH (ref 0–5)
Specific Gravity, Urine: 1.016 (ref 1.005–1.030)
WBC, UA: >50 WBC/hpf — ABNORMAL HIGH (ref 0–5)
pH: 5 (ref 5.0–8.0)

## 2018-03-15 MED ORDER — CEPHALEXIN 500 MG PO CAPS
500.0000 mg | ORAL_CAPSULE | Freq: Once | ORAL | Status: AC
  Administered 2018-03-15: 500 mg via ORAL
  Filled 2018-03-15: qty 1

## 2018-03-15 MED ORDER — CEPHALEXIN 500 MG PO CAPS: 500.0000 mg | ORAL_CAPSULE | Freq: Three times a day (TID) | ORAL | 0 refills | Status: AC

## 2018-03-15 NOTE — Discharge Instructions (Addendum)
Follow-up with your PCP. Take antibiotics as prescribed.

## 2018-03-15 NOTE — ED Triage Notes (Signed)
EMS called by daughter for increased AMS x 2 days. No hx of dementia. Family reports short term memory problems and increase in agitation. Pt had a fall on Saturday. Pt reports LT foot pain. Pt oriented to self and place. CBG 115 per EMS.

## 2018-03-15 NOTE — ED Provider Notes (Signed)
[facility] EMERGENCY DEPARTMENT Provider Note   CSN: [id number] Arrival date & time: 03/15/18  1503     History   Chief Complaint Chief Complaint  Patient presents with  . Altered Mental Status    HPI Lorita OfficerMabel H Kirschenmann is a 84 y.o. female.  HPI   84 year old female brought in by her daughter for evaluation.  This past Saturday patient got out from an adjacent room in the home of her daughter got there she was essentially hanging from a dresser.  Daughter cannot assist her back upright so her assisted her down to the floor.  She did not actually fall.  She has been complaining some increased foot pain since then.  She does have some chronic pain in that foot secondary to orthopedic injury and surgery years ago.  Since that time she has been hesitant to walk.  She uses a walker at baseline.  She has been standing in the shower though. Daughter is also concerned that over the past couple weeks her behavior has changed. She has been making a lot of spiteful comments which is out of character for her. Her daughter questions wether she may be developing dementia.   Past Medical History:  Diagnosis Date  . Diabetes mellitus without complication (HCC)     There are no active problems to display for this patient.   Past Surgical History:  Procedure Laterality Date  . ANKLE SURGERY       OB History   None      Home Medications    Prior to Admission medications   Medication Sig Start Date End Date Taking? Authorizing Provider  HYDROcodone-acetaminophen (NORCO/VICODIN) 5-325 MG per tablet Take 0.5-1 tablets by mouth every 6 (six) hours as needed. 06/12/14   Ward, Layla MawKristen N, DO  ondansetron (ZOFRAN ODT) 4 MG disintegrating tablet Take 1 tablet (4 mg total) by mouth every 8 (eight) hours as needed for nausea or vomiting. 06/12/14   Ward, Layla MawKristen N, DO  tamsulosin (FLOMAX) 0.4 MG CAPS capsule Take 1 capsule (0.4 mg total) by mouth daily. 06/12/14   Ward, Layla MawKristen N, DO    Family  History No family history on file.  Social History Social History   Tobacco Use  . Smoking status: Never Smoker  . Smokeless tobacco: Never Used  Substance Use Topics  . Alcohol use: No  . Drug use: No     Allergies   Penicillins   Review of Systems Review of Systems   Physical Exam Updated Vital Signs Wt 63.5 kg (140 lb)   BMI 24.03 kg/m   Physical Exam  Constitutional: She appears well-developed and well-nourished. No distress.  HENT:  Head: Normocephalic and atraumatic.  Eyes: Conjunctivae are normal. Right eye exhibits no discharge. Left eye exhibits no discharge.  Neck: Neck supple.  Cardiovascular: Normal rate, regular rhythm and normal heart sounds. Exam reveals no gallop and no friction rub.  No murmur heard. Pulmonary/Chest: Effort normal and breath sounds normal. No respiratory distress.  Abdominal: Soft. She exhibits no distension. There is no tenderness.  Musculoskeletal: She exhibits no edema or tenderness.  Healing wound to the mid aspect of the left shin.  There is some subacute appearing ecchymosis and a small underlying hematoma.  No significant bony tenderness.  Mild swelling of the left ankle as compared to the right.  She can actively range the foot/ankle although does report some pain.  He can actively range her left knee and hip.  She does report pain but, when  asked where specifically, she keeps referring to her ankle.  Neurological: She is alert.  Skin: Skin is warm and dry.  Psychiatric: She has a normal mood and affect. Her behavior is normal. Thought content normal.  Nursing note and vitals reviewed.    ED Treatments / Results  Labs (all labs ordered are listed, but only abnormal results are displayed) Labs Reviewed - No data to display  EKG None  Radiology Dg Ankle Complete Left  Result Date: 03/15/2018 CLINICAL DATA:  Left ankle pain.  Recent falls. EXAM: LEFT ANKLE COMPLETE - 3+ VIEW COMPARISON:  Left tibia and fibula x-rays  dated May 17, 2005. FINDINGS: No acute fracture or dislocation. The ankle mortise is symmetric. The talar dome is intact. Mild osteoarthritis of the ankle joint. Interval removal of the distal fibula plate and screws. Unchanged nails within the medial malleolus. Severe osteopenia. Mild soft tissue swelling about the ankle. Vascular calcifications. IMPRESSION: 1.  No acute osseous abnormality. 2. Mild osteoarthritis. Electronically Signed   By: Obie Dredge M.D.   On: 03/15/2018 16:54   Dg Knee Complete 4 Views Left  Result Date: 03/15/2018 CLINICAL DATA:  Left leg pain after multiple falls. EXAM: LEFT KNEE - COMPLETE 4+ VIEW COMPARISON:  Radiographs of July 16, 2015. FINDINGS: No evidence of fracture, dislocation, or joint effusion. No evidence of significant arthropathy or other focal bone abnormality. Vascular calcifications are noted. IMPRESSION: No acute abnormality seen in the left knee. Electronically Signed   By: Lupita Raider, M.D.   On: 03/15/2018 16:55   Dg Hip Unilat W Or Wo Pelvis 2-3 Views Left  Result Date: 03/15/2018 CLINICAL DATA:  Left lower extremity pain after multiple falls. EXAM: DG HIP (WITH OR WITHOUT PELVIS) 2-3V LEFT COMPARISON:  None. FINDINGS: There is no evidence of hip fracture or dislocation. There is no evidence of arthropathy or other focal bone abnormality. IMPRESSION: Normal left hip. Electronically Signed   By: Lupita Raider, M.D.   On: 03/15/2018 16:54    Procedures Procedures (including critical care time)  Medications Ordered in ED Medications - No data to display   Initial Impression / Assessment and Plan / ED Course  I have reviewed the triage vital signs and the nursing notes.  Pertinent labs & imaging results that were available during my care of the patient were reviewed by me and considered in my medical decision making (see chart for details).     84 year old female with some left lower extremity pain.  Based on exam, I doubt acute  fracture.  She does have some mild swelling in the ankle though.  This may be chronic from prior injury but will image.   Her UA is consistent with a UTI and likely explains her recent behavioral changes and may also be contributing to some of the other symptoms such as hesitancy to trust her left leg with walking and unsteadiness.  She is afebrile.  Nontoxic.  I feel she is appropriate for outpatient treatment.  Hopefully her behavior will improve with treatment.   Final Clinical Impressions(s) / ED Diagnoses   Final diagnoses:  None    ED Discharge Orders    None       Raeford Razor, MD 03/18/18 [phone]

## 2018-03-15 NOTE — ED Notes (Signed)
C/o left foot pain.  Daughter reports pt yelled out on Saturday and was hanging onto a dresser.  Daughter says she eased pt to floor.  Pt did not fall.  Pt c/o left foot pain.  Daughter revealed well healing scar to leg lower leg: not related to this visit.

## 2018-03-17 LAB — URINE CULTURE: Culture: >=100000 — AB

## 2018-04-17 DIAGNOSIS — F0390 Unspecified dementia without behavioral disturbance: Secondary | ICD-10-CM | POA: Diagnosis not present

## 2018-04-17 DIAGNOSIS — F320 Major depressive disorder, single episode, mild: Secondary | ICD-10-CM | POA: Diagnosis not present

## 2018-04-17 DIAGNOSIS — Z87440 Personal history of urinary (tract) infections: Secondary | ICD-10-CM | POA: Diagnosis not present

## 2018-04-17 DIAGNOSIS — Z0000 Encounter for general adult medical examination without abnormal findings: Secondary | ICD-10-CM | POA: Diagnosis not present

## 2019-01-16 DIAGNOSIS — R600 Localized edema: Secondary | ICD-10-CM | POA: Diagnosis not present

## 2019-01-16 DIAGNOSIS — M6281 Muscle weakness (generalized): Secondary | ICD-10-CM | POA: Diagnosis not present

## 2019-01-16 DIAGNOSIS — M79605 Pain in left leg: Secondary | ICD-10-CM | POA: Diagnosis not present

## 2019-01-16 DIAGNOSIS — M79604 Pain in right leg: Secondary | ICD-10-CM | POA: Diagnosis not present

## 2019-01-23 DIAGNOSIS — M79605 Pain in left leg: Secondary | ICD-10-CM | POA: Diagnosis not present

## 2019-01-23 DIAGNOSIS — M1990 Unspecified osteoarthritis, unspecified site: Secondary | ICD-10-CM | POA: Diagnosis not present

## 2019-01-23 DIAGNOSIS — F0390 Unspecified dementia without behavioral disturbance: Secondary | ICD-10-CM | POA: Diagnosis not present

## 2019-01-23 DIAGNOSIS — I8391 Asymptomatic varicose veins of right lower extremity: Secondary | ICD-10-CM | POA: Diagnosis not present

## 2019-01-23 DIAGNOSIS — Z9181 History of falling: Secondary | ICD-10-CM | POA: Diagnosis not present

## 2019-01-23 DIAGNOSIS — M6281 Muscle weakness (generalized): Secondary | ICD-10-CM | POA: Diagnosis not present

## 2019-01-23 DIAGNOSIS — M79604 Pain in right leg: Secondary | ICD-10-CM | POA: Diagnosis not present

## 2019-01-23 DIAGNOSIS — F17290 Nicotine dependence, other tobacco product, uncomplicated: Secondary | ICD-10-CM | POA: Diagnosis not present

## 2019-01-25 DIAGNOSIS — I8391 Asymptomatic varicose veins of right lower extremity: Secondary | ICD-10-CM | POA: Diagnosis not present

## 2019-01-25 DIAGNOSIS — M1990 Unspecified osteoarthritis, unspecified site: Secondary | ICD-10-CM | POA: Diagnosis not present

## 2019-01-25 DIAGNOSIS — F0390 Unspecified dementia without behavioral disturbance: Secondary | ICD-10-CM | POA: Diagnosis not present

## 2019-01-25 DIAGNOSIS — M79605 Pain in left leg: Secondary | ICD-10-CM | POA: Diagnosis not present

## 2019-01-25 DIAGNOSIS — Z9181 History of falling: Secondary | ICD-10-CM | POA: Diagnosis not present

## 2019-01-25 DIAGNOSIS — M6281 Muscle weakness (generalized): Secondary | ICD-10-CM | POA: Diagnosis not present

## 2019-01-25 DIAGNOSIS — M79604 Pain in right leg: Secondary | ICD-10-CM | POA: Diagnosis not present

## 2019-01-25 DIAGNOSIS — F17290 Nicotine dependence, other tobacco product, uncomplicated: Secondary | ICD-10-CM | POA: Diagnosis not present

## 2019-03-06 DIAGNOSIS — M6281 Muscle weakness (generalized): Secondary | ICD-10-CM | POA: Diagnosis not present

## 2019-03-06 DIAGNOSIS — Z9181 History of falling: Secondary | ICD-10-CM | POA: Diagnosis not present

## 2019-03-26 DIAGNOSIS — F0390 Unspecified dementia without behavioral disturbance: Secondary | ICD-10-CM | POA: Diagnosis not present

## 2019-03-26 DIAGNOSIS — N390 Urinary tract infection, site not specified: Secondary | ICD-10-CM | POA: Diagnosis not present

## 2019-03-30 DIAGNOSIS — J918 Pleural effusion in other conditions classified elsewhere: Secondary | ICD-10-CM | POA: Diagnosis not present

## 2019-03-30 DIAGNOSIS — G9341 Metabolic encephalopathy: Secondary | ICD-10-CM | POA: Diagnosis not present

## 2019-03-30 DIAGNOSIS — R4182 Altered mental status, unspecified: Secondary | ICD-10-CM | POA: Diagnosis not present

## 2019-03-30 DIAGNOSIS — J939 Pneumothorax, unspecified: Secondary | ICD-10-CM | POA: Diagnosis not present

## 2019-03-30 DIAGNOSIS — J9601 Acute respiratory failure with hypoxia: Secondary | ICD-10-CM | POA: Diagnosis not present

## 2019-03-30 DIAGNOSIS — J95811 Postprocedural pneumothorax: Secondary | ICD-10-CM | POA: Diagnosis not present

## 2019-03-30 DIAGNOSIS — N390 Urinary tract infection, site not specified: Secondary | ICD-10-CM | POA: Diagnosis not present

## 2019-03-30 DIAGNOSIS — R532 Functional quadriplegia: Secondary | ICD-10-CM | POA: Diagnosis not present

## 2019-03-30 DIAGNOSIS — J189 Pneumonia, unspecified organism: Secondary | ICD-10-CM | POA: Diagnosis not present

## 2019-03-30 DIAGNOSIS — R319 Hematuria, unspecified: Secondary | ICD-10-CM | POA: Diagnosis not present

## 2019-03-30 DIAGNOSIS — R410 Disorientation, unspecified: Secondary | ICD-10-CM | POA: Diagnosis not present

## 2019-03-30 DIAGNOSIS — I350 Nonrheumatic aortic (valve) stenosis: Secondary | ICD-10-CM | POA: Diagnosis not present

## 2019-03-30 DIAGNOSIS — I509 Heart failure, unspecified: Secondary | ICD-10-CM | POA: Diagnosis not present

## 2019-03-30 DIAGNOSIS — M6281 Muscle weakness (generalized): Secondary | ICD-10-CM | POA: Diagnosis not present

## 2019-03-30 DIAGNOSIS — I5023 Acute on chronic systolic (congestive) heart failure: Secondary | ICD-10-CM | POA: Diagnosis not present

## 2019-03-30 DIAGNOSIS — Z66 Do not resuscitate: Secondary | ICD-10-CM | POA: Diagnosis not present

## 2019-03-30 DIAGNOSIS — Z9181 History of falling: Secondary | ICD-10-CM | POA: Diagnosis not present

## 2019-03-30 DIAGNOSIS — I110 Hypertensive heart disease with heart failure: Secondary | ICD-10-CM | POA: Diagnosis not present

## 2019-03-30 DIAGNOSIS — J90 Pleural effusion, not elsewhere classified: Secondary | ICD-10-CM | POA: Diagnosis not present

## 2019-03-30 DIAGNOSIS — E860 Dehydration: Secondary | ICD-10-CM | POA: Diagnosis not present

## 2019-03-30 DIAGNOSIS — J9311 Primary spontaneous pneumothorax: Secondary | ICD-10-CM | POA: Diagnosis not present

## 2019-03-30 DIAGNOSIS — I340 Nonrheumatic mitral (valve) insufficiency: Secondary | ICD-10-CM | POA: Diagnosis not present

## 2019-03-30 DIAGNOSIS — B9620 Unspecified Escherichia coli [E. coli] as the cause of diseases classified elsewhere: Secondary | ICD-10-CM | POA: Diagnosis not present

## 2019-05-21 DEATH — deceased

## 2020-06-08 IMAGING — DX DG KNEE COMPLETE 4+V*L*
4 series · 4 of 4 positions shown · non-contrast
Comparison: Radiographs July 16, 2015.

CLINICAL DATA: Left leg pain after multiple falls.

EXAM:
LEFT KNEE - COMPLETE 4+ VIEW

[knee ap (1 of 3)]
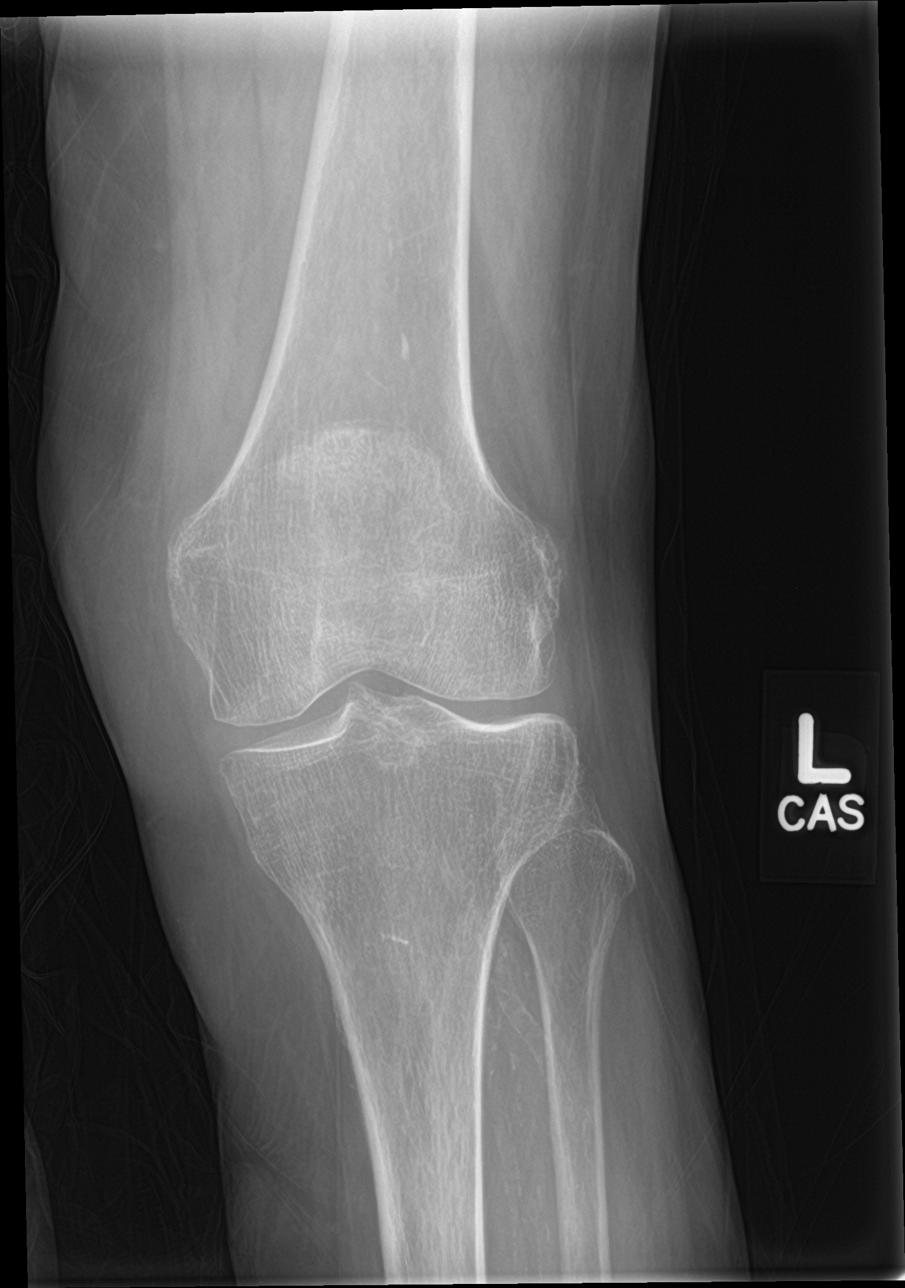

[knee lat]
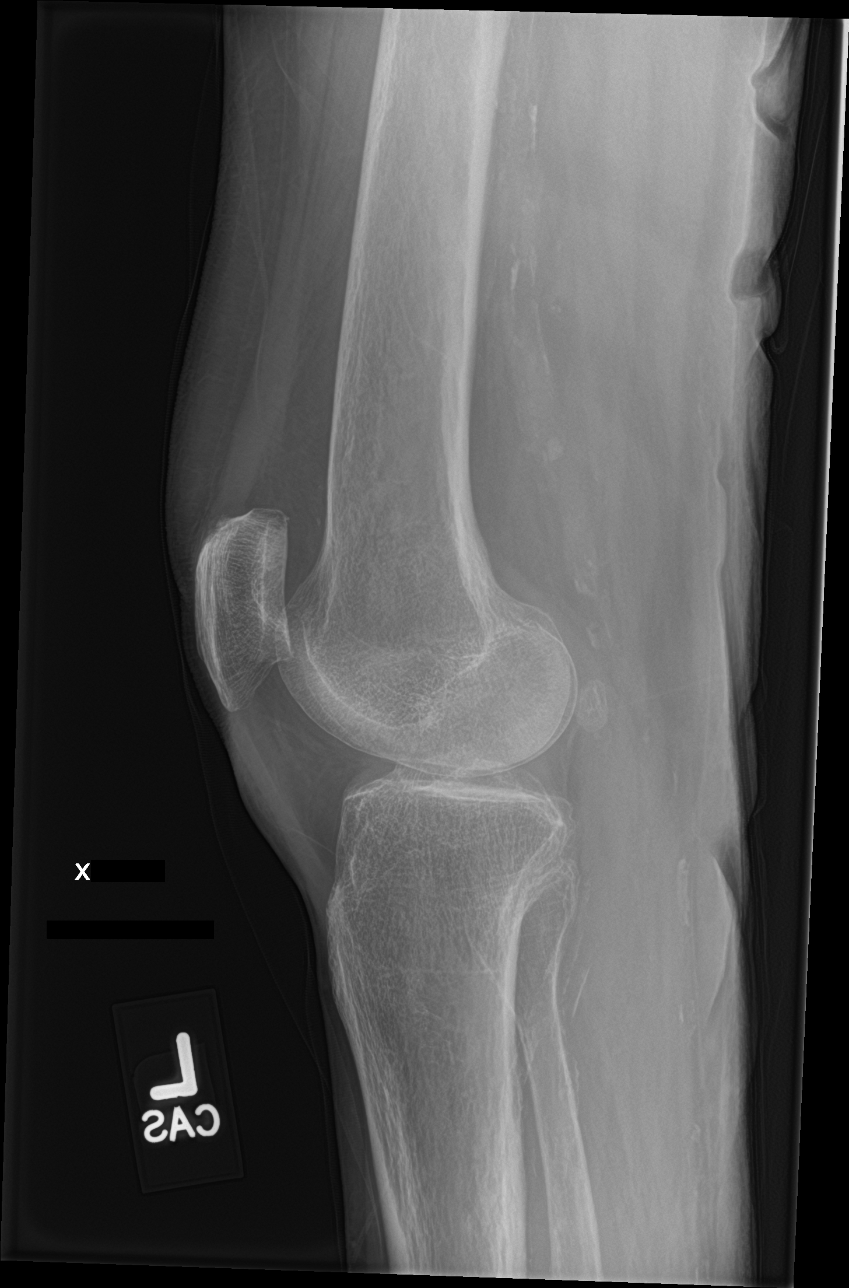

[knee ap (2 of 3)]
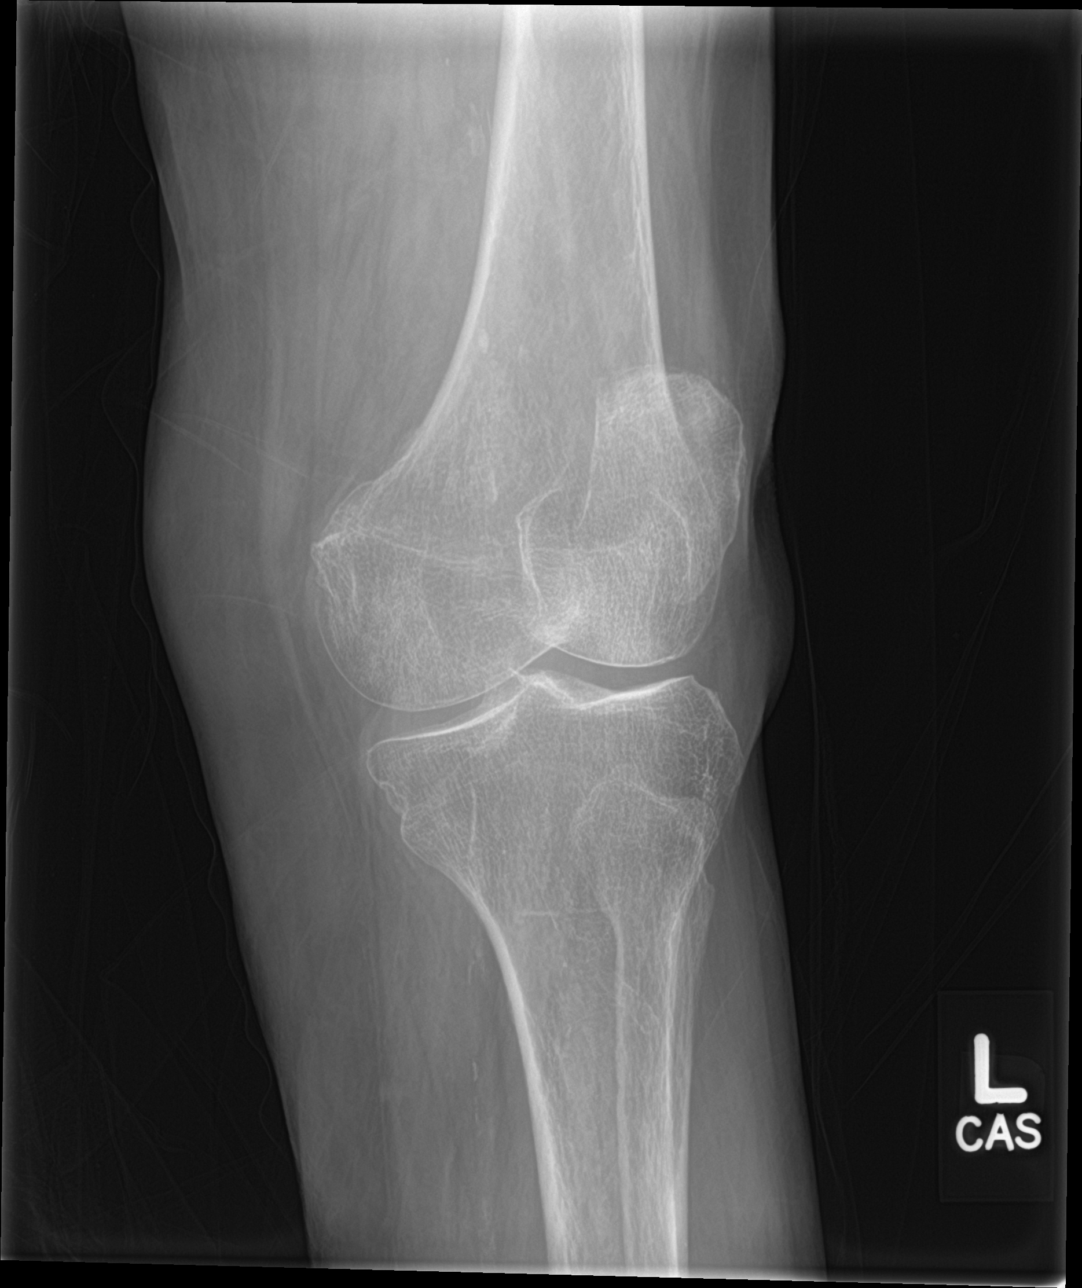

[knee ap (3 of 3)]
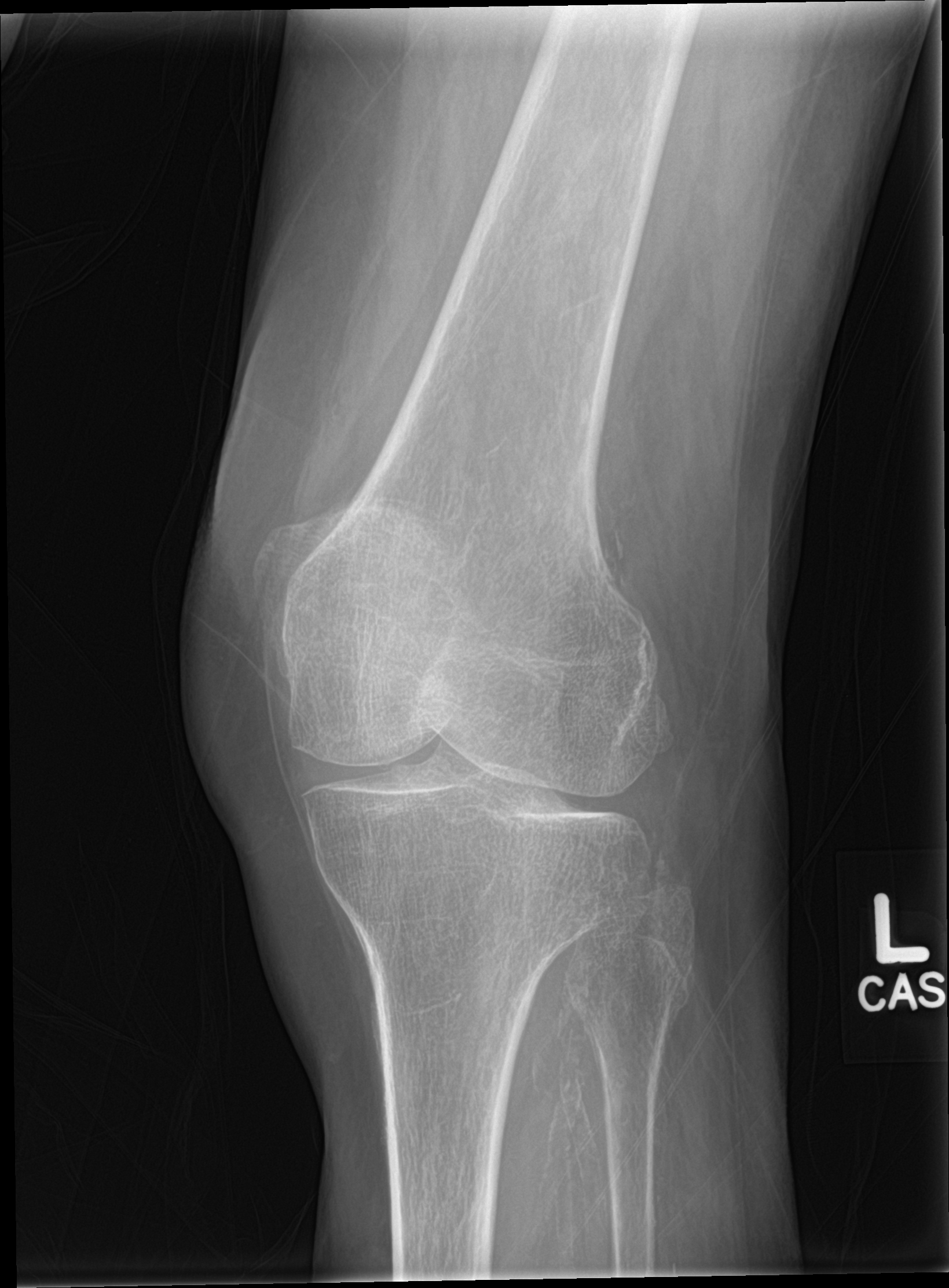

[4 of 4 positions shown; findings below may reference images not displayed]

FINDINGS: No evidence of fracture, dislocation, or joint effusion. No evidence
of significant arthropathy or other focal bone abnormality. Vascular
calcifications are noted.
IMPRESSION: No acute abnormality seen in the left knee.
